# Patient Record
Sex: Male | Born: 1965 | Race: White | Hispanic: No | Marital: Married | Smoking: Former smoker
Health system: Southern US, Community
[De-identification: ages and names within clinical notes are randomized; demographics above are authoritative.]

---

## 2016-02-03 ENCOUNTER — Encounter: Payer: Self-pay | Admitting: *Deleted

## 2016-02-03 ENCOUNTER — Emergency Department (INDEPENDENT_AMBULATORY_CARE_PROVIDER_SITE_OTHER): Payer: Managed Care, Other (non HMO)

## 2016-02-03 ENCOUNTER — Emergency Department
Admission: EM | Admit: 2016-02-03 | Discharge: 2016-02-03 | Disposition: A | Discharge status: Home / Self Care | Payer: Managed Care, Other (non HMO) | Source: Home / Self Care | Attending: Family Medicine | Admitting: Family Medicine

## 2016-02-03 DIAGNOSIS — L03114 Cellulitis of left upper limb: Secondary | ICD-10-CM

## 2016-02-03 DIAGNOSIS — M25542 Pain in joints of left hand: Secondary | ICD-10-CM | POA: Diagnosis not present

## 2016-02-03 DIAGNOSIS — S60512A Abrasion of left hand, initial encounter: Secondary | ICD-10-CM

## 2016-02-03 MED ORDER — DOXYCYCLINE HYCLATE 100 MG PO CAPS: 100.0000 mg | ORAL_CAPSULE | Freq: Two times a day (BID) | ORAL | Status: DC

## 2016-02-03 MED ORDER — MUPIROCIN 2 % EX OINT: 1.0000 "application " | TOPICAL_OINTMENT | Freq: Three times a day (TID) | CUTANEOUS | Status: DC

## 2016-02-03 NOTE — ED Notes (Signed)
Pt's PCP office reports last Tdap was 08/07/2012. Clemens Catholichristy Evalynne Locurto, LPN

## 2016-02-03 NOTE — ED Provider Notes (Signed)
CSN: [id number]     Arrival date & time 02/03/16  0825 History   First MD Initiated Contact with Patient 02/03/16 [phone]     Chief Complaint  Patient presents with  . Foreign Body      HPI Comments: Patient reports that a tree branch fell on the dorsum of his left hand five days ago resulting in an abrasion.  He has had persistent pain and swelling.  He had initially removed several small pieces of bark, and he is concerned that some wood debris may remain in the wound. His last Tdap was 08/07/2012.  Patient is a 50 y.o. male presenting with hand injury. The history is provided by the patient.  Hand Injury Location:  Hand Time since incident:  5 days Injury: yes   Mechanism of injury comment:  Tree branch fell on hand Hand location:  L hand Pain details:    Quality:  Aching   Radiates to:  Does not radiate   Severity:  Mild   Onset quality:  Sudden   Duration:  5 days   Timing:  Constant   Progression:  Unchanged Chronicity:  New Foreign body present:  Wood Tetanus status:  Up to date Prior injury to area:  No Relieved by:  Nothing Worsened by:  Movement Ineffective treatments: antibiotic ointment. Associated symptoms: swelling   Associated symptoms: no decreased range of motion, no fever, no numbness, no stiffness and no tingling     History reviewed. No pertinent past medical history. History reviewed. No pertinent past surgical history. History reviewed. No pertinent family history. Social History  Substance Use Topics  . Smoking status: Former Games developer  . Smokeless tobacco: None  . Alcohol Use: Yes     Comment: 2-3 q wk    Review of Systems  Constitutional: Negative for fever.  Musculoskeletal: Negative for stiffness.  All other systems reviewed and are negative.   Allergies  Review of patient's allergies indicates no known allergies.  Home Medications   Prior to Admission medications   Medication Sig Start Date End Date Taking? Authorizing Provider    doxycycline (VIBRAMYCIN) 100 MG capsule Take 1 capsule (100 mg total) by mouth 2 (two) times daily. Take with food. 02/03/16   Lattie Haw, MD  mupirocin ointment (BACTROBAN) 2 % Apply 1 application topically 3 (three) times daily. 02/03/16   Lattie Haw, MD   Meds Ordered and Administered this Visit  Medications - No data to display  BP 121/75 mmHg  Pulse 80  Temp(Src) 98 F (36.7 C) (Oral)  Resp 18  Ht 6' (1.829 m)  Wt 205 lb (92.987 kg)  BMI 27.80 kg/m2  SpO2 99% No data found.   Physical Exam  Constitutional: He is oriented to person, place, and time. He appears well-developed and well-nourished. No distress.  Eyes: Pupils are equal, round, and reactive to light.  Musculoskeletal:       Left hand: He exhibits tenderness and swelling. He exhibits normal range of motion, no bony tenderness, normal capillary refill and no laceration. Normal sensation noted. Normal strength noted.       Hands: Dorsum of left hand has a 1.5cm diameter area of erythema, swelling, and mild tenderness.  There is a central dark eschar present. Fingers have full range of motion.  Distal neurovascular function is intact.   Neurological: He is alert and oriented to person, place, and time.  Skin: Skin is warm and dry.  Nursing note and vitals reviewed.   ED Course  Procedures  None  Imaging Review Dg Hand Complete Left  02/03/2016  CLINICAL DATA:  Tree limb fell on hand 5 days ago with persistent pain and swelling, initial encounter EXAM: LEFT HAND - COMPLETE 3+ VIEW COMPARISON:  None. FINDINGS: There is no evidence of fracture or dislocation. There is no evidence of arthropathy or other focal bone abnormality. Soft tissues are unremarkable. IMPRESSION: No acute abnormality noted. Electronically Signed   By: Alcide CleverMark  Lukens M.D.   On: 02/03/2016 09:19      MDM   1. Abrasion of left hand, initial encounter   2. Cellulitis of hand, left.  No evidence retained foreign body      Begin doxycycline  100mg  BID for staph coverage.  Add topical Bactroban ointment. May take Ibuprofen 200mg , 4 tabs every 8 hours with food for pain. Followup with Family Doctor if not improved in one week.     Lattie HawStephen A Tharon Bomar, MD 02/03/16 [phone]

## 2016-02-03 NOTE — ED Notes (Signed)
Pt reports dropping a tree limb on his LT hand 5 days ago. He removed a couple of pieces of bark from his hand. He now c/o redness around the wound. Denies fever.

## 2016-02-03 NOTE — Discharge Instructions (Signed)
May take Ibuprofen 200mg , 4 tabs every 8 hours with food for pain.   Cellulitis Cellulitis is an infection of the skin and the tissue beneath it. The infected area is usually red and tender. Cellulitis occurs most often in the arms and lower legs.  CAUSES  Cellulitis is caused by bacteria that enter the skin through cracks or cuts in the skin. The most common types of bacteria that cause cellulitis are staphylococci and streptococci. SIGNS AND SYMPTOMS   Redness and warmth.  Swelling.  Tenderness or pain.  Fever. DIAGNOSIS  Your health care provider can usually determine what is wrong based on a physical exam. Blood tests may also be done. TREATMENT  Treatment usually involves taking an antibiotic medicine. HOME CARE INSTRUCTIONS   Take your antibiotic medicine as directed by your health care provider. Finish the antibiotic even if you start to feel better.  Keep the infected arm or leg elevated to reduce swelling.  Apply a warm cloth to the affected area up to 4 times per day to relieve pain.  Take medicines only as directed by your health care provider.  Keep all follow-up visits as directed by your health care provider. SEEK MEDICAL CARE IF:   You notice red streaks coming from the infected area.  Your red area gets larger or turns dark in color.  Your bone or joint underneath the infected area becomes painful after the skin has healed.  Your infection returns in the same area or another area.  You notice a swollen bump in the infected area.  You develop new symptoms.  You have a fever. SEEK IMMEDIATE MEDICAL CARE IF:   You feel very sleepy.  You develop vomiting or diarrhea.  You have a general ill feeling (malaise) with muscle aches and pains.   This information is not intended to replace advice given to you by your health care provider. Make sure you discuss any questions you have with your health care provider.   Document Released: 08/10/2005 Document  Revised: 07/22/2015 Document Reviewed: 01/16/2012 Elsevier Interactive Patient Education [employer]2016 [employer] Inc.

## 2016-02-06 ENCOUNTER — Telehealth: Payer: Self-pay

## 2017-02-18 IMAGING — CR DG HAND COMPLETE 3+V*L*
3 series · 3 of 3 positions shown · non-contrast
Comparison: None.

CLINICAL DATA: Tree limb fell on hand 5 days ago with persistent
pain and swelling, initial encounter

EXAM:
LEFT HAND - COMPLETE 3+ VIEW

[hand pa]
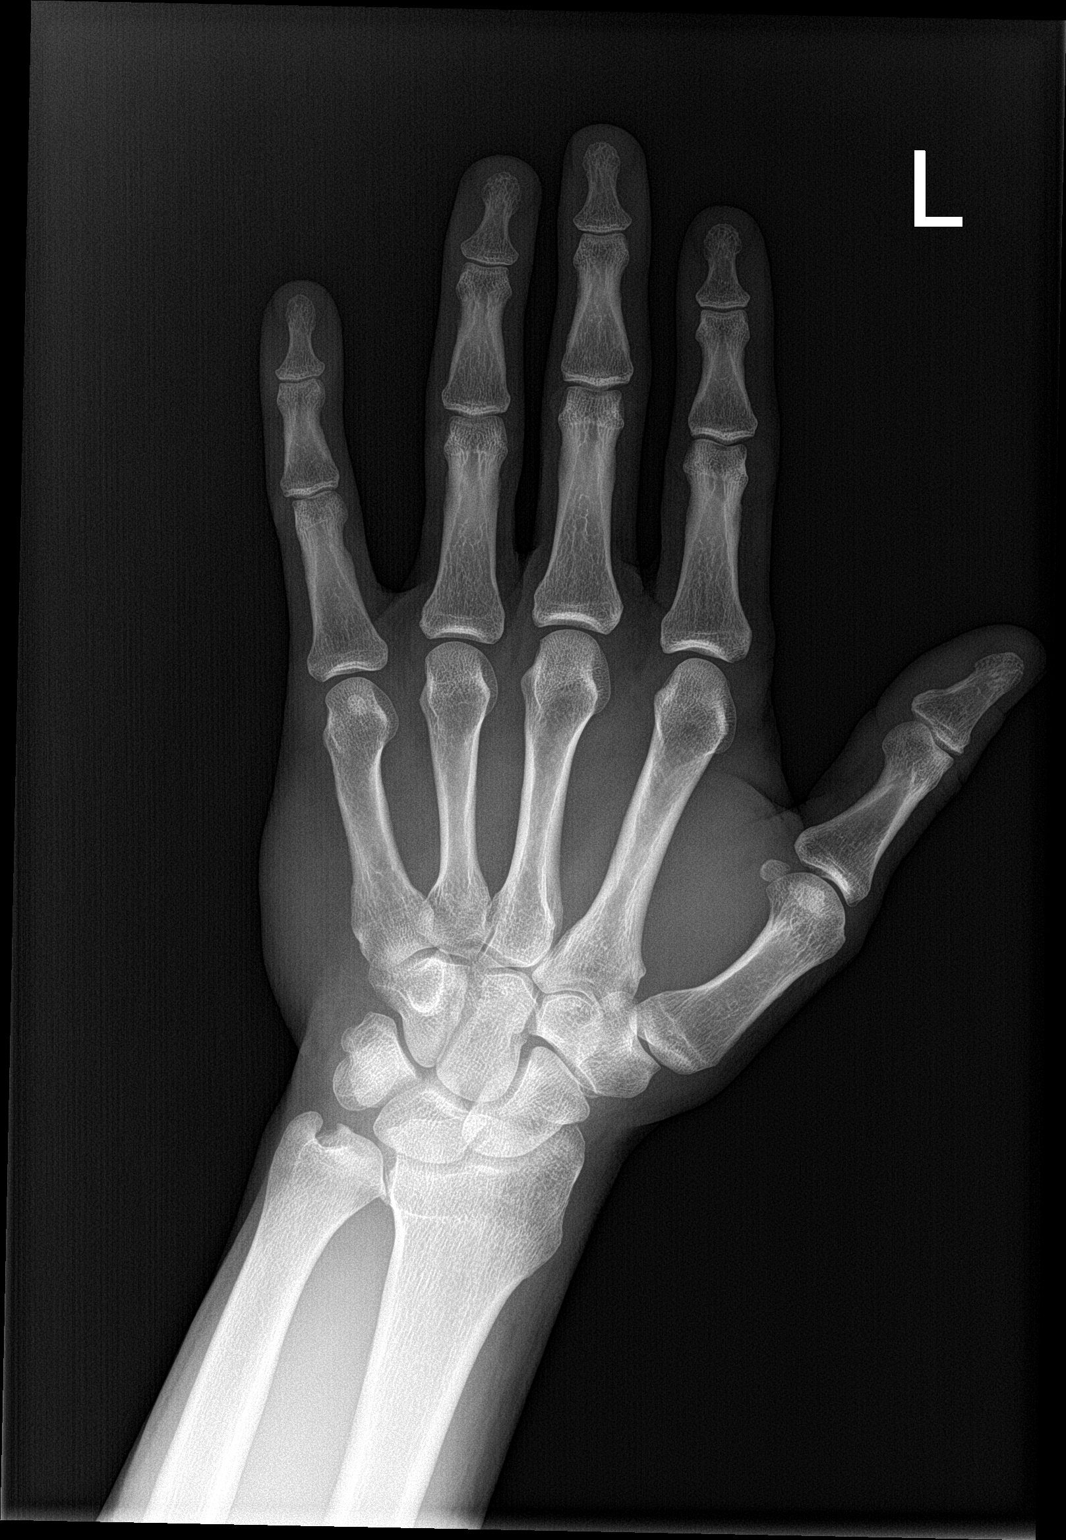

[hand obl]
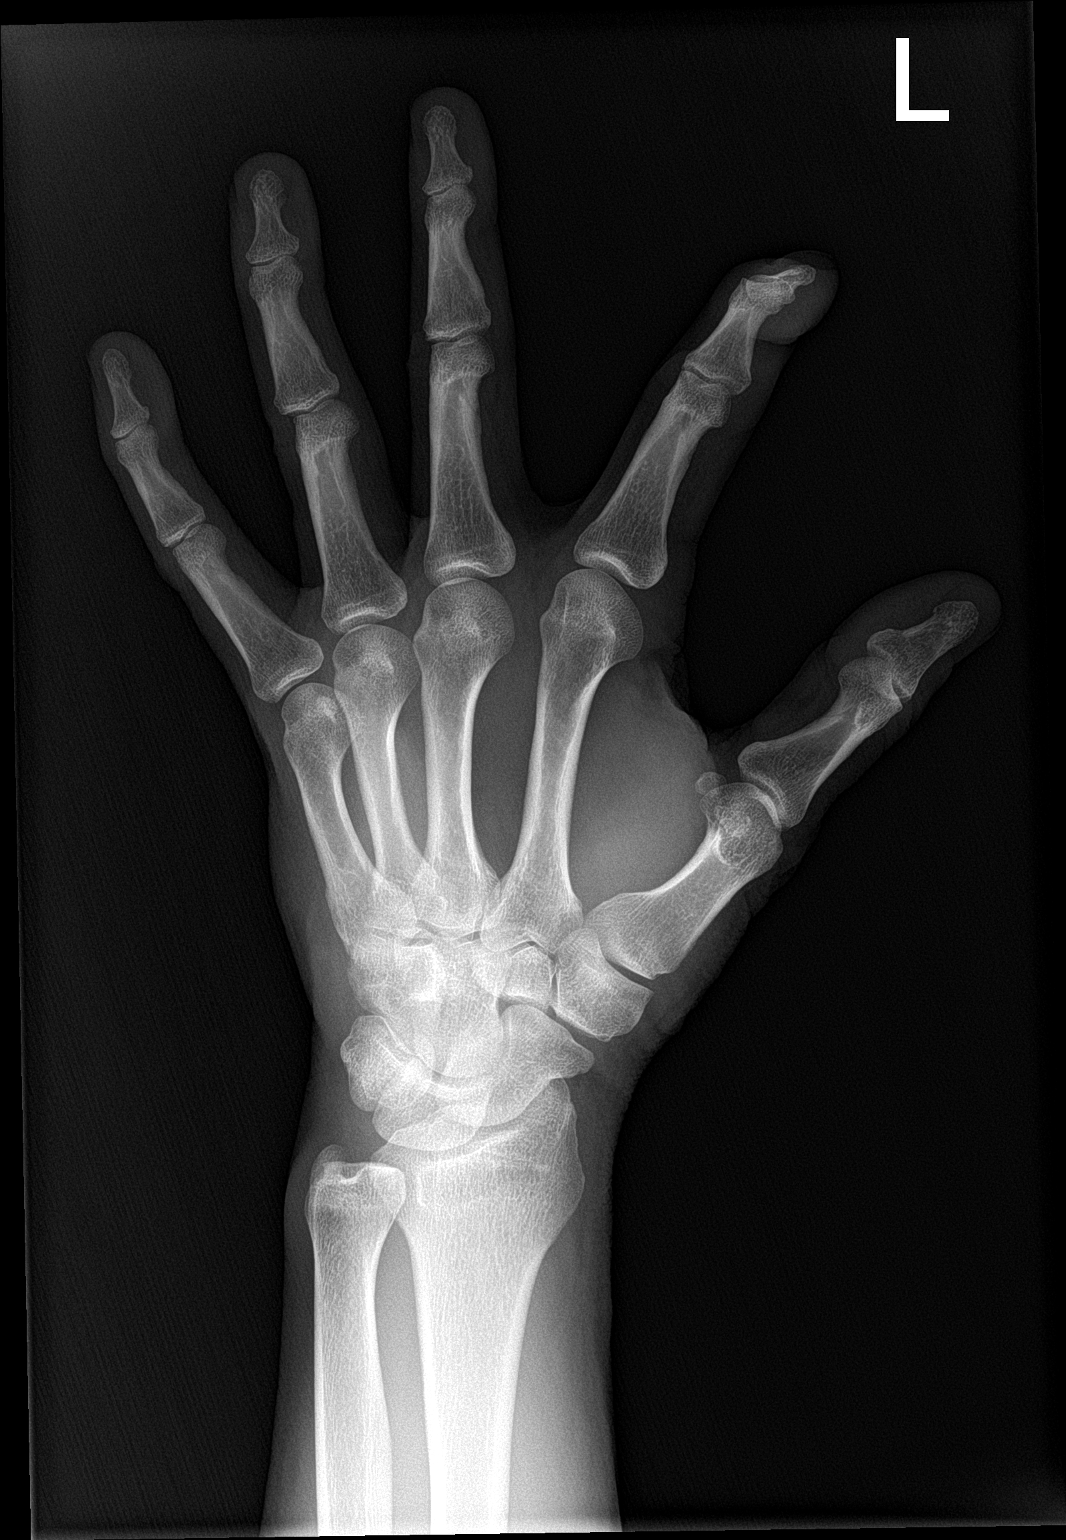

[hand lat]
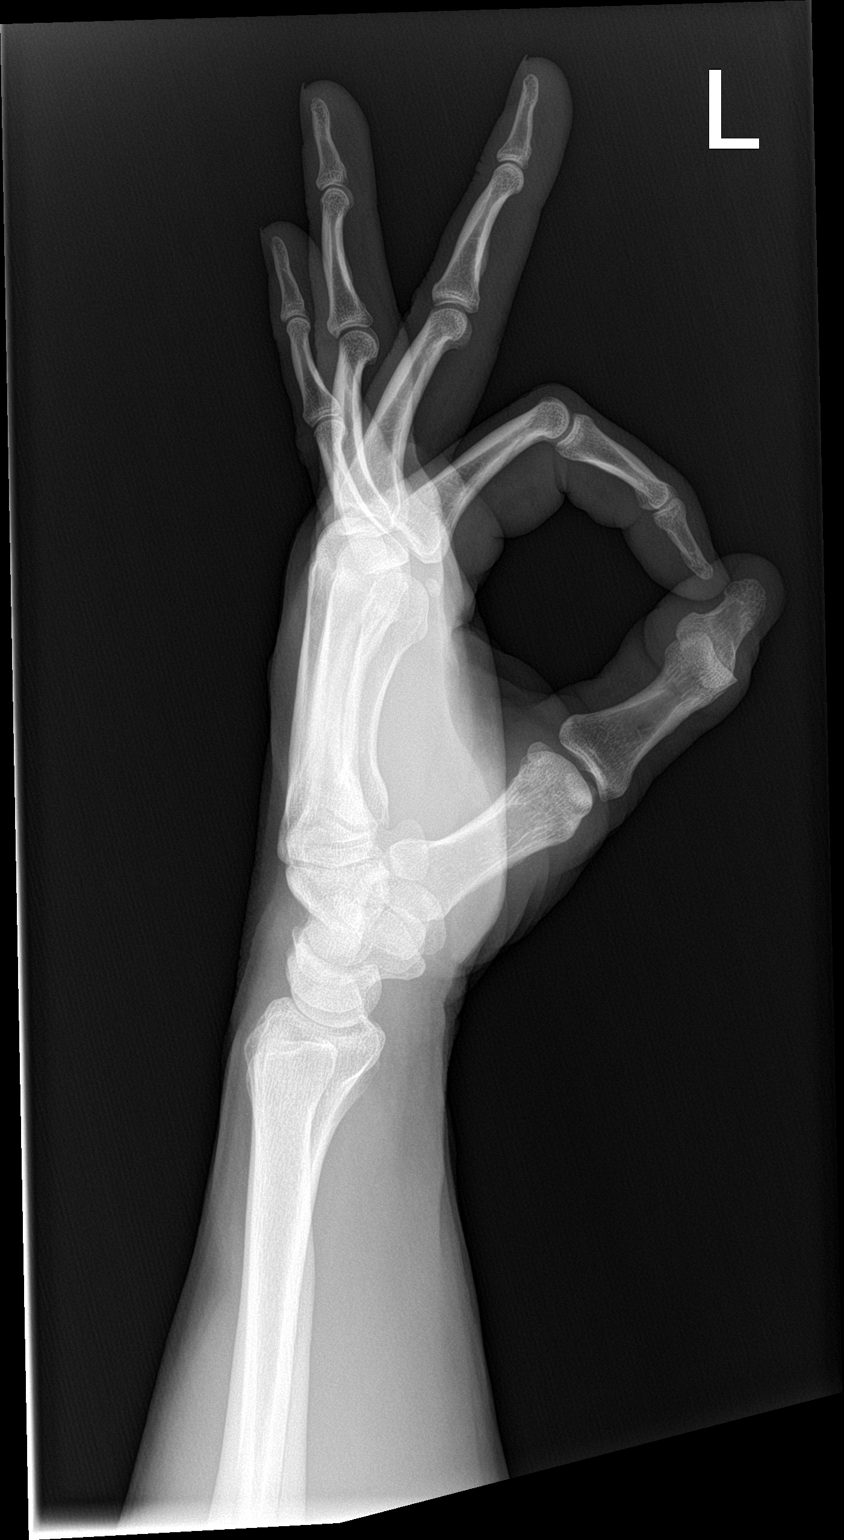

[3 of 3 positions shown; findings below may reference images not displayed]

FINDINGS: There is no evidence of fracture or dislocation. There is no
evidence of arthropathy or other focal bone abnormality. Soft
tissues are unremarkable.
IMPRESSION: No acute abnormality noted.

## 2023-06-25 ENCOUNTER — Encounter: Payer: Self-pay | Admitting: Emergency Medicine

## 2023-06-25 ENCOUNTER — Other Ambulatory Visit: Payer: Self-pay

## 2023-06-25 ENCOUNTER — Ambulatory Visit
Admission: EM | Admit: 2023-06-25 | Discharge: 2023-06-25 | Disposition: A | Discharge status: Home / Self Care | Payer: Commercial Managed Care - HMO | Attending: Family Medicine | Admitting: Family Medicine

## 2023-06-25 DIAGNOSIS — N4889 Other specified disorders of penis: Secondary | ICD-10-CM

## 2023-06-25 DIAGNOSIS — L739 Follicular disorder, unspecified: Secondary | ICD-10-CM

## 2023-06-25 MED ORDER — DOXYCYCLINE HYCLATE 100 MG PO CAPS: 100.0000 mg | ORAL_CAPSULE | Freq: Two times a day (BID) | ORAL | 0 refills | Status: AC

## 2023-06-25 NOTE — ED Provider Notes (Signed)
[facility]    CSN: [id number] Arrival date & time: 06/25/23  0808      History   Chief Complaint Chief Complaint  Patient presents with   Mass    HPI Seth Hurst is a 57 y.o. male.   HPI pleasant 57 year old male presents with bump on penis present for 3 weeks.  History reviewed. No pertinent past medical history.  There are no problems to display for this patient.   History reviewed. No pertinent surgical history.     Home Medications    Prior to Admission medications   Medication Sig Start Date End Date Taking? Authorizing Provider  doxycycline (VIBRAMYCIN) 100 MG capsule Take 1 capsule (100 mg total) by mouth 2 (two) times daily for 10 days. 06/25/23 07/05/23 Yes Trevor Iha, FNP    Family History No family history on file.  Social History Social History   Tobacco Use   Smoking status: Former  Substance Use Topics   Alcohol use: Yes    Comment: 2-3 q wk   Drug use: No     Allergies   Patient has no known allergies.   Review of Systems Review of Systems   Physical Exam Triage Vital Signs ED Triage Vitals  Encounter Vitals Group     BP 06/25/23 0830 134/76     Systolic BP Percentile --      Diastolic BP Percentile --      Pulse Rate 06/25/23 0830 83     Resp 06/25/23 0830 17     Temp 06/25/23 0830 98.2 F (36.8 C)     Temp Source 06/25/23 0830 Oral     SpO2 06/25/23 0830 96 %     Weight --      Height --      Head Circumference --      Peak Flow --      Pain Score 06/25/23 0831 0     Pain Loc --      Pain Education --      Exclude from Growth Chart --    No data found.  Updated Vital Signs BP 134/76 (BP Location: Left Arm)   Pulse 83   Temp 98.2 F (36.8 C) (Oral)   Resp 17   SpO2 96%    Physical Exam Vitals and nursing note reviewed.  Constitutional:      General: He is not in acute distress.    Appearance: Normal appearance. He is normal weight. He is not ill-appearing.  HENT:     Head:  Normocephalic and atraumatic.     Mouth/Throat:     Mouth: Mucous membranes are moist.     Pharynx: Oropharynx is clear.  Eyes:     Extraocular Movements: Extraocular movements intact.     Conjunctiva/sclera: Conjunctivae normal.     Pupils: Pupils are equal, round, and reactive to light.  Cardiovascular:     Rate and Rhythm: Normal rate and regular rhythm.     Pulses: Normal pulses.     Heart sounds: Normal heart sounds.  Pulmonary:     Effort: Pulmonary effort is normal.     Breath sounds: Normal breath sounds. No wheezing, rhonchi or rales.  Genitourinary:    Penis: Normal.      Testes: Normal.     Comments: Penile shaft (lateral aspect): Tiny (<0.5 cm) ovoid shaped erythematous epidermal cyst-please see image below Musculoskeletal:        General: Normal range of motion.     Cervical back: Normal range  of motion and neck supple.  Skin:    General: Skin is warm and dry.  Neurological:     General: No focal deficit present.     Mental Status: He is alert and oriented to person, place, and time. Mental status is at baseline.  Psychiatric:        Mood and Affect: Mood normal.        Behavior: Behavior normal.        Thought Content: Thought content normal.      UC Treatments / Results  Labs (all labs ordered are listed, but only abnormal results are displayed) Labs Reviewed - No data to display  EKG   Radiology No results found.  Procedures Procedures (including critical care time)  Medications Ordered in UC Medications - No data to display  Initial Impression / Assessment and Plan / UC Course  I have reviewed the triage vital signs and the nursing notes.  Pertinent labs & imaging results that were available during my care of the patient were reviewed by me and considered in my medical decision making (see chart for details).     MDM: 1.  Epidermoid cyst of skin of penis-Rx'd Doxycycline 100 mg capsule twice daily x 10 days; 2.  Folliculitis-Rx'd Doxycycline  100 mg capsule twice daily x 7 days. Advised patient to take medication as directed with food to completion.  Encouraged increase daily water intake to 64 ounces per day while taking this medication.  Advised avoid shaving affected area for the next 2 weeks.  Advised if symptoms worsen and/or unresolved please follow-up with PCP or here for further evaluation.  Advised patient that he may establish care with [place] [facility] at [facility] tomorrow Monday, 06/26/2023.  Contact information provided on this AVS. patient discharged home, hemodynamically stable. Final Clinical Impressions(s) / UC Diagnoses   Final diagnoses:  Folliculitis  Epidermoid cyst of skin of penis     Discharge Instructions      Advised patient to take medication as directed with food to completion.  Encouraged increase daily water intake to 64 ounces per day while taking this medication.  Advised avoid shaving affected area for the next 2 weeks.  Advised if symptoms worsen and/or unresolved please follow-up with PCP or here for further evaluation.  Advised patient that he may establish care with [place] [facility] at [facility] tomorrow Monday, 06/26/2023.  Contact information provided on this AVS.     ED Prescriptions     Medication Sig Dispense Auth. Provider   doxycycline (VIBRAMYCIN) 100 MG capsule Take 1 capsule (100 mg total) by mouth 2 (two) times daily for 10 days. 20 capsule Trevor Iha, FNP      PDMP not reviewed this encounter.   Trevor Iha, FNP 06/25/23 [phone]

## 2023-06-25 NOTE — Discharge Instructions (Addendum)
Advised patient to take medication as directed with food to completion.  Encouraged increase daily water intake to 64 ounces per day while taking this medication.  Advised avoid shaving affected area for the next 2 weeks.  Advised if symptoms worsen and/or unresolved please follow-up with PCP or here for further evaluation.  Advised patient that he may establish care with [place] primary care at [facility] tomorrow Monday, 06/26/2023.  Contact information provided on this AVS.

## 2023-06-25 NOTE — ED Triage Notes (Addendum)
Pt reports that couple weeks ago was working out in yard. Came in and noticed bites all over him, then later noticed itching on penis and then bump came up. Reports that doesn't itch or hurt now, denies drainage. Per patient bump Is a small red, about half the size of a be be under the skin.
